# Patient Record
Sex: Male | Born: 1960 | Race: White | Hispanic: No | Marital: Married | Smoking: Current every day smoker
Health system: Southern US, Community
[De-identification: ages and names within clinical notes are randomized; demographics above are authoritative.]

## PROBLEM LIST (undated history)

## (undated) DIAGNOSIS — I10 Essential (primary) hypertension: Secondary | ICD-10-CM

## (undated) HISTORY — PX: KNEE SURGERY: SHX244

---

## 2005-11-17 ENCOUNTER — Ambulatory Visit (HOSPITAL_COMMUNITY): Admission: RE | Admit: 2005-11-17 | Discharge: 2005-11-17 | Payer: Self-pay | Admitting: Orthopedic Surgery

## 2006-01-05 ENCOUNTER — Ambulatory Visit (HOSPITAL_BASED_OUTPATIENT_CLINIC_OR_DEPARTMENT_OTHER): Admission: RE | Admit: 2006-01-05 | Discharge: 2006-01-05 | Payer: Self-pay | Admitting: Orthopedic Surgery

## 2006-10-05 ENCOUNTER — Emergency Department (HOSPITAL_COMMUNITY): Admission: EM | Admit: 2006-10-05 | Discharge: 2006-10-05 | Payer: Self-pay | Admitting: Family Medicine

## 2007-04-29 ENCOUNTER — Ambulatory Visit (HOSPITAL_BASED_OUTPATIENT_CLINIC_OR_DEPARTMENT_OTHER): Admission: RE | Admit: 2007-04-29 | Discharge: 2007-04-29 | Payer: Self-pay | Admitting: Orthopedic Surgery

## 2010-07-22 ENCOUNTER — Emergency Department (HOSPITAL_COMMUNITY): Admission: EM | Admit: 2010-07-22 | Discharge: 2010-07-22 | Payer: Self-pay | Admitting: Emergency Medicine

## 2010-09-16 ENCOUNTER — Ambulatory Visit: Payer: Self-pay | Admitting: Cardiology

## 2010-09-16 ENCOUNTER — Encounter (INDEPENDENT_AMBULATORY_CARE_PROVIDER_SITE_OTHER): Payer: Self-pay | Admitting: Otolaryngology

## 2010-09-16 ENCOUNTER — Ambulatory Visit (HOSPITAL_COMMUNITY): Admission: RE | Admit: 2010-09-16 | Discharge: 2010-09-17 | Payer: Self-pay | Admitting: Otolaryngology

## 2010-09-21 DIAGNOSIS — B1910 Unspecified viral hepatitis B without hepatic coma: Secondary | ICD-10-CM | POA: Insufficient documentation

## 2010-09-21 DIAGNOSIS — B159 Hepatitis A without hepatic coma: Secondary | ICD-10-CM | POA: Insufficient documentation

## 2010-09-21 DIAGNOSIS — Z87442 Personal history of urinary calculi: Secondary | ICD-10-CM

## 2010-09-21 DIAGNOSIS — F17200 Nicotine dependence, unspecified, uncomplicated: Secondary | ICD-10-CM

## 2010-09-21 DIAGNOSIS — I10 Essential (primary) hypertension: Secondary | ICD-10-CM | POA: Insufficient documentation

## 2011-01-17 LAB — ANAEROBIC CULTURE

## 2011-01-17 LAB — CBC
HCT: 45 % (ref 39.0–52.0)
Hemoglobin: 15.7 g/dL (ref 13.0–17.0)
MCH: 32.6 pg (ref 26.0–34.0)
MCHC: 34.9 g/dL (ref 30.0–36.0)
MCV: 93.4 fL (ref 78.0–100.0)
Platelets: 257 10*3/uL (ref 150–400)
RBC: 4.82 MIL/uL (ref 4.22–5.81)
RDW: 13.3 % (ref 11.5–15.5)
WBC: 11.3 10*3/uL — ABNORMAL HIGH (ref 4.0–10.5)

## 2011-01-17 LAB — BASIC METABOLIC PANEL WITH GFR
CO2: 27 meq/L (ref 19–32)
Chloride: 102 meq/L (ref 96–112)
GFR calc Af Amer: >60 mL/min (ref >60)
Potassium: 3.8 meq/L (ref 3.5–5.1)
Sodium: 138 meq/L (ref 135–145)

## 2011-01-17 LAB — BASIC METABOLIC PANEL
BUN: 6 mg/dL (ref 6–23)
Calcium: 9.2 mg/dL (ref 8.4–10.5)
Creatinine, Ser: 0.85 mg/dL (ref 0.4–1.5)
GFR calc non Af Amer: >60 mL/min (ref >60)
Glucose, Bld: 127 mg/dL — ABNORMAL HIGH (ref 70–99)

## 2011-01-17 LAB — SURGICAL PCR SCREEN
MRSA, PCR: NEGATIVE
Staphylococcus aureus: NEGATIVE

## 2011-01-17 LAB — URINALYSIS, ROUTINE W REFLEX MICROSCOPIC
Glucose, UA: NEGATIVE mg/dL
Hgb urine dipstick: NEGATIVE
Ketones, ur: NEGATIVE mg/dL
Nitrite: NEGATIVE
Protein, ur: NEGATIVE mg/dL
Specific Gravity, Urine: 1.026 (ref 1.005–1.030)
Urobilinogen, UA: 1 mg/dL (ref 0.0–1.0)
pH: 6 (ref 5.0–8.0)

## 2011-01-17 LAB — CULTURE, ROUTINE-SINUS

## 2011-01-19 LAB — CBC
HCT: 46.2 % (ref 39.0–52.0)
Hemoglobin: 15.8 g/dL (ref 13.0–17.0)
MCH: 32.1 pg (ref 26.0–34.0)
MCHC: 34.2 g/dL (ref 30.0–36.0)
RDW: 13.6 % (ref 11.5–15.5)

## 2011-01-19 LAB — COMPREHENSIVE METABOLIC PANEL
ALT: 64 U/L — ABNORMAL HIGH (ref 0–53)
CO2: 27 mEq/L (ref 19–32)
Calcium: 9.2 mg/dL (ref 8.4–10.5)
Creatinine, Ser: 1.14 mg/dL (ref 0.4–1.5)
GFR calc Af Amer: >60 mL/min (ref >60)
GFR calc non Af Amer: >60 mL/min (ref >60)
Glucose, Bld: 137 mg/dL — ABNORMAL HIGH (ref 70–99)
Sodium: 140 mEq/L (ref 135–145)
Total Protein: 7.7 g/dL (ref 6.0–8.3)

## 2011-01-19 LAB — URINE CULTURE
Culture  Setup Time: 201109162122
Culture: NO GROWTH

## 2011-01-19 LAB — DIFFERENTIAL
Lymphocytes Relative: 17 % (ref 12–46)
Lymphs Abs: 2 10*3/uL (ref 0.7–4.0)
Monocytes Relative: 6 % (ref 3–12)
Neutro Abs: 9.2 10*3/uL — ABNORMAL HIGH (ref 1.7–7.7)
Neutrophils Relative %: 76 % (ref 43–77)

## 2011-01-19 LAB — URINALYSIS, ROUTINE W REFLEX MICROSCOPIC
Bilirubin Urine: NEGATIVE
Ketones, ur: 15 mg/dL — AB
Leukocytes, UA: NEGATIVE
Nitrite: NEGATIVE
Protein, ur: NEGATIVE mg/dL
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
## 2011-03-24 NOTE — Op Note (Signed)
NAME:  Stephen Duncan, Stephen Duncan           ACCOUNT NO.:  [id number]   MEDICAL RECORD NO.:  [id number]          PATIENT TYPE:  AMB   LOCATION:  [facility]                         FACILITY:  [facility]   PHYSICIAN:  Marlowe Kays, M.D.  DATE OF BIRTH:  [birth year]   DATE OF PROCEDURE:  01/05/2006  DATE OF DISCHARGE:                                 OPERATIVE REPORT   PREOP DIAGNOSES:  1.  Torn medial meniscus.  2.  Osteoarthritis left knee.   POSTOP DIAGNOSES:  1.  Torn medial meniscus.  2.  Osteoarthritis left knee.   OPERATION:  Left knee arthroscopy with partial medial meniscectomy and  shaving of medial femoral condyle.   SURGEON:  Marlowe Kays, M.D.   ASSISTANT:  None.   ANESTHESIA:  General.   INDICATIONS FOR PROCEDURE:  Because of medial joint line pain and swelling.  He had an MRI of the left knee demonstrating the above findings.  He is here  today for the above-mentioned surgery for this reason.   DESCRIPTION OF PROCEDURE:  After satisfactory general anesthesia and a  pneumatic tourniquet, the left leg was Esmarched out nonsterilely.  A thigh  stabilizer applied and leg was prepped from stabilizer-to-ankle, DuraPrep,  and draped in a sterile field. Ace wrap and knee support for right knee.  Superior medial saline inflow, first through an anterolateral portal; medial  compartment of the knee joint was evaluated. He had a little scuffing of the  anterior portion of the medial meniscus with some synovitis present.  The  meniscus was shaved down until smooth; and the synovitis resected. He had  very substantial partial detachment of articular cartilage of the medial  femoral condyle; and I shaved this down until smooth with a combination of  3.5 shaver and baskets.  The posterior horn tear of the medial meniscus  extended just proximal to the curve, all the way into the intercondylar  area, but mainly at the curve and actually went back all the way to the rim  of the meniscus. I  resected the meniscus back to healthy appearing stable  rim.  Looking up in the medial gutter and suprapatellar area. He had some  minimal wear of the patella, but nothing arthroscopically treatable. I then  reversed portals, he had a little synovitis laterally. Once this was  resected with the shaver; had a good visualization of the lateral joint and  no treatable abnormalities were noted. There was minimal chondromalacia.   The knee joint was then irrigated until clear and all fluid possible was  removed.  The two anterior portals were closed with 4-0 nylon, 20 mL of 1/2%  Marcaine with adrenalin and 4 mg of morphine were then instilled through  the inflow apparatus which was removed; and this portal closed with 4-0  Nylon as well.  Betadine Adaptic dry sterile dressing were applied.  Tourniquet was released. He tolerated the procedure well and was taken to  the recovery room in satisfactory condition with no known complications.           ______________________________  Marlowe Kays, M.D.     JA/MEDQ  D:  01/05/2006  T:  01/05/2006  Job:  [id number]

## 2011-08-23 LAB — POCT HEMOGLOBIN-HEMACUE
Hemoglobin: 16.3
Operator id: 114531

## 2015-10-26 ENCOUNTER — Encounter (HOSPITAL_COMMUNITY): Payer: Self-pay | Admitting: *Deleted

## 2015-10-26 ENCOUNTER — Emergency Department (HOSPITAL_COMMUNITY)

## 2015-10-26 ENCOUNTER — Emergency Department (HOSPITAL_COMMUNITY)
Admission: EM | Admit: 2015-10-26 | Discharge: 2015-10-26 | Disposition: A | Discharge status: Home / Self Care | Attending: Emergency Medicine | Admitting: Emergency Medicine

## 2015-10-26 DIAGNOSIS — F17210 Nicotine dependence, cigarettes, uncomplicated: Secondary | ICD-10-CM | POA: Diagnosis not present

## 2015-10-26 DIAGNOSIS — I10 Essential (primary) hypertension: Secondary | ICD-10-CM | POA: Diagnosis not present

## 2015-10-26 DIAGNOSIS — N200 Calculus of kidney: Secondary | ICD-10-CM | POA: Insufficient documentation

## 2015-10-26 DIAGNOSIS — R1031 Right lower quadrant pain: Secondary | ICD-10-CM | POA: Diagnosis present

## 2015-10-26 DIAGNOSIS — R509 Fever, unspecified: Secondary | ICD-10-CM | POA: Diagnosis not present

## 2015-10-26 HISTORY — DX: Essential (primary) hypertension: I10

## 2015-10-26 LAB — CBC
HEMATOCRIT: 50.6 % (ref 39.0–52.0)
HEMOGLOBIN: 17 g/dL (ref 13.0–17.0)
MCH: 32.6 pg (ref 26.0–34.0)
MCHC: 33.6 g/dL (ref 30.0–36.0)
MCV: 96.9 fL (ref 78.0–100.0)
Platelets: 260 10*3/uL (ref 150–400)
RBC: 5.22 MIL/uL (ref 4.22–5.81)
RDW: 13.3 % (ref 11.5–15.5)
WBC: 8.3 10*3/uL (ref 4.0–10.5)

## 2015-10-26 LAB — URINALYSIS, ROUTINE W REFLEX MICROSCOPIC
Bilirubin Urine: NEGATIVE
Glucose, UA: NEGATIVE mg/dL
Ketones, ur: NEGATIVE mg/dL
Leukocytes, UA: NEGATIVE
Nitrite: NEGATIVE
Protein, ur: NEGATIVE mg/dL
Specific Gravity, Urine: 1.025 (ref 1.005–1.030)
pH: 5 (ref 5.0–8.0)

## 2015-10-26 LAB — COMPREHENSIVE METABOLIC PANEL
ALBUMIN: 4.4 g/dL (ref 3.5–5.0)
ALK PHOS: 98 U/L (ref 38–126)
ALT: 80 U/L — ABNORMAL HIGH (ref 17–63)
ANION GAP: 11 (ref 5–15)
AST: 58 U/L — AB (ref 15–41)
BILIRUBIN TOTAL: 0.9 mg/dL (ref 0.3–1.2)
BUN: 12 mg/dL (ref 6–20)
CO2: 28 mmol/L (ref 22–32)
Calcium: 9.6 mg/dL (ref 8.9–10.3)
Chloride: 102 mmol/L (ref 101–111)
Creatinine, Ser: 1.05 mg/dL (ref 0.61–1.24)
GFR calc Af Amer: >60 mL/min (ref >60)
GFR calc non Af Amer: >60 mL/min (ref >60)
GLUCOSE: 157 mg/dL — AB (ref 65–99)
POTASSIUM: 3.9 mmol/L (ref 3.5–5.1)
SODIUM: 141 mmol/L (ref 135–145)
Total Protein: 8.4 g/dL — ABNORMAL HIGH (ref 6.5–8.1)

## 2015-10-26 LAB — URINE MICROSCOPIC-ADD ON

## 2015-10-26 LAB — LIPASE, BLOOD: Lipase: 28 U/L (ref 11–51)

## 2015-10-26 LAB — CBG MONITORING, ED: Glucose-Capillary: 134 mg/dL — ABNORMAL HIGH (ref 65–99)

## 2015-10-26 MED ORDER — OXYCODONE HCL 5 MG PO TABS: 5.0000 mg | ORAL_TABLET | ORAL | Status: AC | PRN

## 2015-10-26 MED ORDER — TAMSULOSIN HCL 0.4 MG PO CAPS: 0.4000 mg | ORAL_CAPSULE | Freq: Every day | ORAL | Status: AC

## 2015-10-26 MED ORDER — ONDANSETRON HCL 4 MG/2ML IJ SOLN
4.0000 mg | Freq: Once | INTRAMUSCULAR | Status: AC
  Administered 2015-10-26: 4 mg via INTRAVENOUS
  Filled 2015-10-26: qty 2

## 2015-10-26 MED ORDER — IOHEXOL 300 MG/ML  SOLN
100.0000 mL | Freq: Once | INTRAMUSCULAR | Status: AC | PRN
  Administered 2015-10-26: 100 mL via INTRAVENOUS

## 2015-10-26 MED ORDER — ONDANSETRON 4 MG PO TBDP: ORAL_TABLET | ORAL | Status: DC

## 2015-10-26 MED ORDER — KETOROLAC TROMETHAMINE 30 MG/ML IJ SOLN
30.0000 mg | Freq: Once | INTRAMUSCULAR | Status: AC
  Administered 2015-10-26: 30 mg via INTRAVENOUS
  Filled 2015-10-26: qty 1

## 2015-10-26 MED ORDER — MORPHINE SULFATE (PF) 4 MG/ML IV SOLN
4.0000 mg | Freq: Once | INTRAVENOUS | Status: AC
  Administered 2015-10-26: 4 mg via INTRAVENOUS
  Filled 2015-10-26: qty 1

## 2015-10-26 MED ORDER — SODIUM CHLORIDE 0.9 % IV BOLUS (SEPSIS)
1000.0000 mL | Freq: Once | INTRAVENOUS | Status: AC
  Administered 2015-10-26: 1000 mL via INTRAVENOUS

## 2015-10-26 NOTE — Progress Notes (Signed)
[facility] ED CM noted pt with coverage but no pcp listed Spoke with pt who confirms no pcp Pt states he last saw jame Little and would probably return to him [facility] ED CM spoke with pt on how to obtain an in network pcp with insurance coverage via the customer service number or web site  Cm reviewed ED level of care for crisis/emergent services and community pcp level of care to manage continuous or chronic medical concerns.  The pt voiced understanding CM encouraged pt and discussed pt's responsibility to verify with pt's insurance carrier that any recommended medical provider offered by any emergency room or a hospital provider is within the carrier's network. The pt voiced understanding

## 2015-10-26 NOTE — Discharge Instructions (Signed)
Follow-up with urology. Strain urine so that they can analyze the stone. Return for fever uncontrolled pain and inability to drink. Kidney Stones Kidney stones (urolithiasis) are deposits that form inside your kidneys. The intense pain is caused by the stone moving through the urinary tract. When the stone moves, the ureter goes into spasm around the stone. The stone is usually passed in the urine.  CAUSES   A disorder that makes certain neck glands produce too much parathyroid hormone (primary hyperparathyroidism).  A buildup of uric acid crystals, similar to gout in your joints.  Narrowing (stricture) of the ureter.  A kidney obstruction present at birth (congenital obstruction).  Previous surgery on the kidney or ureters.  Numerous kidney infections. SYMPTOMS   Feeling sick to your stomach (nauseous).  Throwing up (vomiting).  Blood in the urine (hematuria).  Pain that usually spreads (radiates) to the groin.  Frequency or urgency of urination. DIAGNOSIS   Taking a history and physical exam.  Blood or urine tests.  CT scan.  Occasionally, an examination of the inside of the urinary bladder (cystoscopy) is performed. TREATMENT   Observation.  Increasing your fluid intake.  Extracorporeal shock wave lithotripsy--This is a noninvasive procedure that uses shock waves to break up kidney stones.  Surgery may be needed if you have severe pain or persistent obstruction. There are various surgical procedures. Most of the procedures are performed with the use of small instruments. Only small incisions are needed to accommodate these instruments, so recovery time is minimized. The size, location, and chemical composition are all important variables that will determine the proper choice of action for you. Talk to your health care provider to better understand your situation so that you will minimize the risk of injury to yourself and your kidney.  HOME CARE INSTRUCTIONS   Drink  enough water and fluids to keep your urine clear or pale yellow. This will help you to pass the stone or stone fragments.  Strain all urine through the provided strainer. Keep all particulate matter and stones for your health care provider to see. The stone causing the pain may be as small as a grain of salt. It is very important to use the strainer each and every time you pass your urine. The collection of your stone will allow your health care provider to analyze it and verify that a stone has actually passed. The stone analysis will often identify what you can do to reduce the incidence of recurrences.  Only take over-the-counter or prescription medicines for pain, discomfort, or fever as directed by your health care provider.  Keep all follow-up visits as told by your health care provider. This is important.  Get follow-up X-rays if required. The absence of pain does not always mean that the stone has passed. It may have only stopped moving. If the urine remains completely obstructed, it can cause loss of kidney function or even complete destruction of the kidney. It is your responsibility to make sure X-rays and follow-ups are completed. Ultrasounds of the kidney can show blockages and the status of the kidney. Ultrasounds are not associated with any radiation and can be performed easily in a matter of minutes.  Make changes to your daily diet as told by your health care provider. You may be told to:  Limit the amount of salt that you eat.  Eat 5 or more servings of fruits and vegetables each day.  Limit the amount of meat, poultry, fish, and eggs that you eat.  Collect a 24-hour urine sample as told by your health care provider.You may need to collect another urine sample every 6-12 months. SEEK MEDICAL CARE IF:  You experience pain that is progressive and unresponsive to any pain medicine you have been prescribed. SEEK IMMEDIATE MEDICAL CARE IF:   Pain cannot be controlled with the  prescribed medicine.  You have a fever or shaking chills.  The severity or intensity of pain increases over 18 hours and is not relieved by pain medicine.  You develop a new onset of abdominal pain.  You feel faint or pass out.  You are unable to urinate.   This information is not intended to replace advice given to you by your health care provider. Make sure you discuss any questions you have with your health care provider.   Document Released: 10/23/2005 Document Revised: 07/14/2015 Document Reviewed: 03/26/2013 Elsevier Interactive Patient Education [employer].

## 2015-10-26 NOTE — ED Notes (Signed)
Discharge instructions, follow-up care, and rx x3 reviewed with patient. Patient verbalized understanding. Patient given a urine strainer to catch stone.

## 2015-10-26 NOTE — ED Notes (Signed)
Pt reports RLQ pain since this morning. Had a bowel movement but this did not provide relief. Reports he has been sweating for last hour, nausea present, denies vomiting. denies blood in urine or stool.

## 2015-10-26 NOTE — ED Provider Notes (Signed)
CSN: [id number]     Arrival date & time 10/26/15  1043 History   First MD Initiated Contact with Patient 10/26/15 1119     Chief Complaint  Patient presents with  . Abdominal Pain     (Consider location/radiation/quality/duration/timing/severity/associated sxs/prior Treatment) Patient is a 54 y.o. male presenting with abdominal pain. The history is provided by the patient.  Abdominal Pain Pain location:  RLQ Pain quality: sharp and shooting   Pain radiates to:  Does not radiate Pain severity:  Moderate Onset quality:  Sudden Duration:  6 hours Timing:  Constant Progression:  Unchanged Chronicity:  New Relieved by:  Nothing Worsened by:  Nothing tried Ineffective treatments:  Bowel activity Associated symptoms: chills, fever and nausea   Associated symptoms: no chest pain, no diarrhea, no shortness of breath and no vomiting     54 yo M with a chief complaint of right lower quadrant abdominal pain. The started this morning. Patient tried had bowel movement but with no relief. Says it feels like a mild burning. Denies radiation subjective fevers and chills nausea but no vomiting. Positive for anorexia. No prior abdominal surgery.  Past Medical History  Diagnosis Date  . Hypertension    Past Surgical History  Procedure Laterality Date  . Knee surgery      left    History reviewed. No pertinent family history. Social History  Substance Use Topics  . Smoking status: Current Every Day Smoker -- 1.50 packs/day for 30 years    Types: Cigarettes  . Smokeless tobacco: None  . Alcohol Use: No    Review of Systems  Constitutional: Positive for fever and chills.  HENT: Negative for congestion and facial swelling.   Eyes: Negative for discharge and visual disturbance.  Respiratory: Negative for shortness of breath.   Cardiovascular: Negative for chest pain and palpitations.  Gastrointestinal: Positive for nausea and abdominal pain. Negative for vomiting and diarrhea.   Musculoskeletal: Negative for myalgias and arthralgias.  Skin: Negative for color change and rash.  Neurological: Negative for tremors, syncope and headaches.  Psychiatric/Behavioral: Negative for confusion and dysphoric mood.      Allergies  Review of patient's allergies indicates no known allergies.  Home Medications   Prior to Admission medications   Medication Sig Start Date End Date Taking? Authorizing Provider  AMOXICILLIN PO Take 1 tablet by mouth once.   Yes Historical Provider, MD  naproxen sodium (ANAPROX) 220 MG tablet Take 220 mg by mouth 2 (two) times daily as needed (pain).   Yes Historical Provider, MD  ondansetron (ZOFRAN ODT) 4 MG disintegrating tablet  ODT q4 hours prn nausea/vomit 10/26/15   Melene Plan, DO  oxyCODONE (ROXICODONE) 5 MG immediate release tablet Take 1 tablet (5 mg total) by mouth every 4 (four) hours as needed for severe pain. 10/26/15   Melene Plan, DO  tamsulosin (FLOMAX) 0.4 MG CAPS capsule Take 1 capsule (0.4 mg total) by mouth daily after supper. 10/26/15   Melene Plan, DO   BP 157/96 mmHg  Pulse 75  Temp(Src) 97.6 F (36.4 C) (Oral)  Resp 18  SpO2 94% Physical Exam  Constitutional: He is oriented to person, place, and time. He appears well-developed and well-nourished.  HENT:  Head: Normocephalic and atraumatic.  Eyes: EOM are normal. Pupils are equal, round, and reactive to light.  Neck: Normal range of motion. Neck supple. No JVD present.  Cardiovascular: Normal rate and regular rhythm.  Exam reveals no gallop and no friction rub.   No murmur heard.  Pulmonary/Chest: No respiratory distress. He has no wheezes.  Abdominal: He exhibits no distension. There is tenderness (tender palpation worst at the extreme right lower abdomen. Negative Ross takes negative psoas). There is no rebound and no guarding.  Musculoskeletal: Normal range of motion.  Neurological: He is alert and oriented to person, place, and time.  Skin: No rash noted. No  pallor.  Psychiatric: He has a normal mood and affect. His behavior is normal.  Nursing note and vitals reviewed.   ED Course  Procedures (including critical care time) Labs Review Labs Reviewed  COMPREHENSIVE METABOLIC PANEL - Abnormal; Notable for the following:    Glucose, Bld 157 (*)    Total Protein 8.4 (*)    AST 58 (*)    ALT 80 (*)    All other components within normal limits  URINALYSIS, ROUTINE W REFLEX MICROSCOPIC (NOT AT [facility]) - Abnormal; Notable for the following:    Color, Urine AMBER (*)    APPearance CLOUDY (*)    Hgb urine dipstick MODERATE (*)    All other components within normal limits  URINE MICROSCOPIC-ADD ON - Abnormal; Notable for the following:    Squamous Epithelial / LPF 0-5 (*)    Bacteria, UA RARE (*)    Casts GRANULAR CAST (*)    All other components within normal limits  CBG MONITORING, ED - Abnormal; Notable for the following:    Glucose-Capillary 134 (*)    All other components within normal limits  LIPASE, BLOOD  CBC    Imaging Review Ct Abdomen Pelvis W Contrast  10/26/2015  CLINICAL DATA:  Patient with right lower quadrant abdominal pain since this morning. EXAM: CT ABDOMEN AND PELVIS WITH CONTRAST TECHNIQUE: Multidetector CT imaging of the abdomen and pelvis was performed using the standard protocol following bolus administration of intravenous contrast. CONTRAST:  OMNIPAQUE IOHEXOL 300 MG/ML  SOLN COMPARISON:  CT abdomen pelvis 07/22/2010 FINDINGS: Lower chest: Normal heart size. Dependent atelectasis within the bilateral lobes. There is a 5 mm right middle lobe pulmonary nodule (image 4; series 6). No pleural effusion. Hepatobiliary: Liver is normal in size and contour. No focal hepatic lesion is identified. Gallbladder is unremarkable. Pancreas: Unremarkable Spleen: Unremarkable Adrenals/Urinary Tract: Unchanged mild nodularity of left adrenal gland. Right adrenal gland is normal. Multiple 2-3 mm stones are demonstrated within the right  kidney, particularly within the interpolar region and lower pole. The right kidney is enlarged and delayed in enhancement. Delayed images demonstrate no excretion of contrast material into the right renal collecting system ureter. There is moderate right hydroureteronephrosis to level of a 3 mm stone within the distal right ureter at the level of the UVJ. Simple cyst off the superior pole left kidney measuring 2.2 cm. Multiple additional bilateral renal hypodensities too small to characterize. Right renal perinephric fat stranding. Stomach/Bowel: Sigmoid colonic diverticulosis. No CT evidence for acute diverticulitis. The appendix is normal. No abnormal bowel wall thickening or evidence for bowel obstruction. Stomach is normal morphology. Vascular/Lymphatic: Normal caliber abdominal aorta. Circumaortic left renal vein. No retroperitoneal lymphadenopathy. No pelvic lymphadenopathy. Other: Prostate is unremarkable. Left-greater-than-right bilateral fat containing inguinal hernias. Musculoskeletal: Lumbar spine degenerative changes. IMPRESSION: There is an obstructing 3 mm stone within the distal right ureter at the UVJ resulting in moderate right hydroureteronephrosis. Additional bilateral nephrolithiasis as described above. 5 mm right middle lobe nodule. If the patient is at high risk for bronchogenic carcinoma, follow-up chest CT at 6-12 months is recommended. If the patient is at low risk for  bronchogenic carcinoma, follow-up chest CT at 12 months is recommended. This recommendation follows the consensus statement: Guidelines for Management of Small Pulmonary Nodules Detected on CT Scans: A Statement from the Fleischner Society as published in Radiology 2005;237:395-400. Electronically Signed   By: Annia Beltrew  Davis M.D.   On: 10/26/2015 14:28   I have personally reviewed and evaluated these images and lab results as part of my medical decision-making.   EKG Interpretation None      MDM   Final diagnoses:   Nephrolithiasis    54  Yo M with a chief complaint of right lower quadrant abdominal pain. This pain is lower than I would normally expect for appendicitis. Patient having no testicular or penile tenderness. Will obtain a CT scan to evaluate.  CT scan with 3mm stone in the UVJ.  Pain well controlled, awaiting UA.   UA negative for infection, pain well controlled.  Urology follow up.   I have discussed the diagnosis/risks/treatment options with the patient and family and believe the pt to be eligible for discharge home to follow-up with Urology. We also discussed returning to the ED immediately if new or worsening sx occur. We discussed the sx which are most concerning (e.g., sudden worsening pain, fever, inability to tolerate by mouth) that necessitate immediate return. Medications administered to the patient during their visit and any new prescriptions provided to the patient are listed below.  Medications given during this visit Medications  morphine 4 MG/ML injection 4 mg (4 mg Intravenous Given 10/26/15 1158)  ondansetron (ZOFRAN) injection 4 mg (4 mg Intravenous Given 10/26/15 1158)  sodium chloride 0.9 % bolus 1,000 mL (0 mLs Intravenous Stopped 10/26/15 1246)  iohexol (OMNIPAQUE) 300 MG/ML solution 100 mL (100 mLs Intravenous Contrast Given 10/26/15 1357)  ketorolac (TORADOL) 30 MG/ML injection 30 mg (30 mg Intravenous Given 10/26/15 1519)  morphine 4 MG/ML injection 4 mg (4 mg Intravenous Given 10/26/15 1518)  ondansetron (ZOFRAN) injection 4 mg (4 mg Intravenous Given 10/26/15 1519)    Discharge Medication List as of 10/26/2015  3:59 PM      The patient appears reasonably screen and/or stabilized for discharge and I doubt any other medical condition or other [facility] requiring further screening, evaluation, or treatment in the ED at this time prior to discharge.    Melene Planan Ashleigh Luckow, DO 10/27/15 [phone]

## 2021-08-14 ENCOUNTER — Encounter (HOSPITAL_COMMUNITY): Payer: Self-pay | Admitting: Emergency Medicine

## 2021-08-14 ENCOUNTER — Emergency Department (HOSPITAL_COMMUNITY)
Admission: EM | Admit: 2021-08-14 | Discharge: 2021-08-14 | Disposition: A | Discharge status: Home / Self Care | Attending: Emergency Medicine | Admitting: Emergency Medicine

## 2021-08-14 ENCOUNTER — Emergency Department (HOSPITAL_COMMUNITY)

## 2021-08-14 DIAGNOSIS — F17210 Nicotine dependence, cigarettes, uncomplicated: Secondary | ICD-10-CM | POA: Insufficient documentation

## 2021-08-14 DIAGNOSIS — E669 Obesity, unspecified: Secondary | ICD-10-CM | POA: Diagnosis not present

## 2021-08-14 DIAGNOSIS — K5730 Diverticulosis of large intestine without perforation or abscess without bleeding: Secondary | ICD-10-CM | POA: Diagnosis not present

## 2021-08-14 DIAGNOSIS — N200 Calculus of kidney: Secondary | ICD-10-CM

## 2021-08-14 DIAGNOSIS — I10 Essential (primary) hypertension: Secondary | ICD-10-CM | POA: Diagnosis not present

## 2021-08-14 DIAGNOSIS — N132 Hydronephrosis with renal and ureteral calculous obstruction: Secondary | ICD-10-CM | POA: Diagnosis not present

## 2021-08-14 DIAGNOSIS — R109 Unspecified abdominal pain: Secondary | ICD-10-CM | POA: Diagnosis present

## 2021-08-14 LAB — BASIC METABOLIC PANEL
Anion gap: 12 (ref 5–15)
BUN: 15 mg/dL (ref 8–23)
CO2: 22 mmol/L (ref 22–32)
Calcium: 9.8 mg/dL (ref 8.9–10.3)
Chloride: 108 mmol/L (ref 98–111)
Creatinine, Ser: 1.09 mg/dL (ref 0.61–1.24)
GFR, Estimated: >60 mL/min (ref >60)
Glucose, Bld: 272 mg/dL — ABNORMAL HIGH (ref 70–99)
Potassium: 4.4 mmol/L (ref 3.5–5.1)
Sodium: 142 mmol/L (ref 135–145)

## 2021-08-14 LAB — CBC
HCT: 47.8 % (ref 39.0–52.0)
Hemoglobin: 16 g/dL (ref 13.0–17.0)
MCH: 32.4 pg (ref 26.0–34.0)
MCHC: 33.5 g/dL (ref 30.0–36.0)
MCV: 96.8 fL (ref 80.0–100.0)
Platelets: 291 10*3/uL (ref 150–400)
RBC: 4.94 MIL/uL (ref 4.22–5.81)
RDW: 12.9 % (ref 11.5–15.5)
WBC: 10 10*3/uL (ref 4.0–10.5)
nRBC: 0 % (ref 0.0–0.2)

## 2021-08-14 LAB — URINALYSIS, ROUTINE W REFLEX MICROSCOPIC
Bilirubin Urine: NEGATIVE
Glucose, UA: 50 mg/dL — AB
Ketones, ur: NEGATIVE mg/dL
Leukocytes,Ua: NEGATIVE
Nitrite: NEGATIVE
Protein, ur: 30 mg/dL — AB
RBC / HPF: >50 RBC/hpf — ABNORMAL HIGH (ref 0–5)
Specific Gravity, Urine: 1.025 (ref 1.005–1.030)
pH: 5 (ref 5.0–8.0)

## 2021-08-14 MED ORDER — KETOROLAC TROMETHAMINE 30 MG/ML IJ SOLN
15.0000 mg | Freq: Once | INTRAMUSCULAR | Status: AC
  Administered 2021-08-14: 15 mg via INTRAMUSCULAR
  Filled 2021-08-14: qty 1

## 2021-08-14 MED ORDER — ONDANSETRON 4 MG PO TBDP: 4.0000 mg | ORAL_TABLET | Freq: Three times a day (TID) | ORAL | 0 refills | Status: AC | PRN

## 2021-08-14 NOTE — Discharge Instructions (Addendum)
You were seen today for pain.  Your pain is likely related to kidney stone.  Continue Tylenol as needed for pain.  Take Zofran for nausea.  Make sure that you are staying well-hydrated.  Follow-up with urology as needed.  You likely pass the stone on your own.

## 2021-08-14 NOTE — ED Notes (Signed)
Pt took 1000 mg of acetaminophen around 23:30 prior to arrival.

## 2021-08-14 NOTE — ED Triage Notes (Signed)
Pt c/o dull right sided flank pain x 1 hour. Hx of kidney stones. CBG 260

## 2021-08-14 NOTE — ED Provider Notes (Signed)
[facility]-EMERGENCY DEPT Provider Note   CSN: [id number] Arrival date & time: 08/14/21  0022     History Chief Complaint  Patient presents with   Flank Pain    Stephen Duncan is a 60 y.o. male.  HPI     This is a 60 year old male with a history of hypertension and hepatitis who presents with nausea and right sided flank pain.  Patient reports that he developed dull right-sided flank pain 1 hour prior to arrival.  He states he had a kidney stone 8 years ago that felt similar.  Pain did not radiate.  He took 2 extra strength Tylenol with improvement of his pain.  Currently he is pain-free.  He reports associated nausea and vomiting related to pain prior to arrival.  Denies any recent fevers.  He does state that he urinated while in the waiting room and noted blood in his urine.  Past Medical History:  Diagnosis Date   Hypertension     Patient Active Problem List   Diagnosis Date Noted   HEPATITIS A 09/21/2010   HEPATITIS B 09/21/2010   TOBACCO USER 09/21/2010   HYPERTENSION 09/21/2010   RENAL CALCULUS, HX OF 09/21/2010    Past Surgical History:  Procedure Laterality Date   KNEE SURGERY     left        No family history on file.  Social History   Tobacco Use   Smoking status: Every Day    Packs/day: 1.50    Years: 30.00    Pack years: 45.00    Types: Cigarettes  Substance Use Topics   Alcohol use: No    Home Medications Prior to Admission medications   Medication Sig Start Date End Date Taking? Authorizing Provider  ondansetron (ZOFRAN ODT) 4 MG disintegrating tablet Take 1 tablet (4 mg total) by mouth every 8 (eight) hours as needed for nausea or vomiting. 08/14/21  Yes Caydance Kuehnle, Mayer Masker, MD  AMOXICILLIN PO Take 1 tablet by mouth once.    [provider]  naproxen sodium (ANAPROX) 220 MG tablet Take 220 mg by mouth 2 (two) times daily as needed (pain).    [provider]  oxyCODONE (ROXICODONE) 5 MG immediate  release tablet Take 1 tablet (5 mg total) by mouth every 4 (four) hours as needed for severe pain. 10/26/15   Melene Plan, DO  tamsulosin (FLOMAX) 0.4 MG CAPS capsule Take 1 capsule (0.4 mg total) by mouth daily after supper. 10/26/15   Melene Plan, DO    Allergies    Patient has no known allergies.  Review of Systems   Review of Systems  Constitutional:  Negative for fever.  Respiratory:  Negative for shortness of breath.   Cardiovascular:  Negative for chest pain.  Gastrointestinal:  Negative for abdominal pain.  Genitourinary:  Positive for flank pain and hematuria. Negative for dysuria.  All other systems reviewed and are negative.  Physical Exam Updated Vital Signs BP (!) 126/107   Pulse 99   Temp 99.9 F (37.7 C) (Oral)   Resp 18   Ht 1.778 m (5\' 10" )   Wt 122.5 kg   SpO2 96%   BMI 38.74 kg/m   Physical Exam Vitals and nursing note reviewed.  Constitutional:      Appearance: He is well-developed. He is obese. He is not ill-appearing.  HENT:     Head: Normocephalic and atraumatic.     Mouth/Throat:     Mouth: Mucous membranes are moist.  Eyes:  Pupils: Pupils are equal, round, and reactive to light.  Cardiovascular:     Rate and Rhythm: Normal rate and regular rhythm.     Heart sounds: Normal heart sounds.  Pulmonary:     Effort: Pulmonary effort is normal. No respiratory distress.     Breath sounds: Normal breath sounds. No wheezing.  Abdominal:     General: Bowel sounds are normal.     Palpations: Abdomen is soft.     Tenderness: There is no abdominal tenderness. There is no right CVA tenderness, left CVA tenderness or rebound.  Musculoskeletal:     Cervical back: Neck supple.     Right lower leg: No edema.     Left lower leg: No edema.  Skin:    General: Skin is warm and dry.  Neurological:     Mental Status: He is alert and oriented to person, place, and time.  Psychiatric:        Mood and Affect: Mood normal.    ED Results / Procedures /  Treatments   Labs (all labs ordered are listed, but only abnormal results are displayed) Labs Reviewed  URINALYSIS, ROUTINE W REFLEX MICROSCOPIC - Abnormal; Notable for the following components:      Result Value   APPearance CLOUDY (*)    Glucose, UA 50 (*)    Hgb urine dipstick LARGE (*)    Protein, ur 30 (*)    RBC / HPF >50 (*)    Bacteria, UA RARE (*)    All other components within normal limits  BASIC METABOLIC PANEL - Abnormal; Notable for the following components:   Glucose, Bld 272 (*)    All other components within normal limits  CBC    EKG None  Radiology CT Renal Stone Study  Result Date: 08/14/2021 CLINICAL DATA:  60 year old male with history of dull right-sided flank pain for 1 hour. EXAM: CT ABDOMEN AND PELVIS WITHOUT CONTRAST TECHNIQUE: Multidetector CT imaging of the abdomen and pelvis was performed following the standard protocol without IV contrast. COMPARISON:  CT the abdomen and pelvis 10/26/2015. FINDINGS: Lower chest: Mild scarring in the lung bases bilaterally. Atherosclerotic calcifications in the distal descending thoracic aorta. Hepatobiliary: Diffuse low attenuation throughout the hepatic parenchyma, indicative of hepatic steatosis. No discrete cystic or solid hepatic lesions are confidently identified on today's noncontrast CT examination. Unenhanced appearance of the gallbladder is normal. Pancreas: No definite pancreatic mass or peripancreatic fluid collections or inflammatory changes are noted on today's noncontrast CT examination. Spleen: Unremarkable. Adrenals/Urinary Tract: Multiple nonobstructive calculi are noted within the collecting system of the right kidney measuring up to 7 mm in the lower pole. In addition, in the proximal third of the right kidney (axial image 47 of series 3) there is a 3 mm calculus. This is associated with mild proximal right hydroureteronephrosis. No additional calculi are identified within the collecting system of the left  kidney, along the course of the left ureter or within the lumen of the urinary bladder. No left hydroureteronephrosis. Subcentimeter low-attenuation lesions in the interpolar region of the right kidney and interpolar region of the left kidney, incompletely characterized on today's non-contrast CT examination, but similar compared to the prior CT from 10/26/2015, statistically likely to represent cysts. Urinary bladder is nearly completely decompressed, but otherwise unremarkable in appearance. Bilateral adrenal glands are normal in appearance. Stomach/Bowel: The unenhanced appearance of the stomach is normal. No pathologic dilatation of small bowel or colon. Numerous colonic diverticulae are noted, particularly in the descending colon and  sigmoid colon, without surrounding inflammatory changes to suggest an acute diverticulitis at this time. Normal appendix. Vascular/Lymphatic: Aortic atherosclerosis. Circumaortic left renal vein (normal anatomical variant) incidentally noted. No lymphadenopathy noted in the abdomen or pelvis. Reproductive: Prostate gland and seminal vesicles are unremarkable in appearance. Other: No significant volume of ascites.  No pneumoperitoneum. Musculoskeletal: There are no aggressive appearing lytic or blastic lesions noted in the visualized portions of the skeleton. IMPRESSION: 1. 3 mm obstructive calculus in the proximal third of the right ureter. 2. Nonobstructive calculi in the right renal collecting system measuring up to 7 mm in the lower pole. 3. Extensive colonic diverticulosis without evidence to suggest acute diverticulitis at this time. 4. Aortic atherosclerosis. Electronically Signed   By: Trudie Reed M.D.   On: 08/14/2021 05:53    Procedures Procedures   Medications Ordered in ED Medications  ketorolac (TORADOL) 30 MG/ML injection 15 mg (15 mg Intramuscular Given 08/14/21 1829)    ED Course  I have reviewed the triage vital signs and the nursing  notes.  Pertinent labs & imaging results that were available during my care of the patient were reviewed by me and considered in my medical decision making (see chart for details).    MDM Rules/Calculators/A&P                           Patient presents with right-sided flank pain.  Mostly resolved on my evaluation.  He is overall nontoxic and vital signs are reassuring.  Exam is benign.  Highly suspect kidney stone given history of the same with similar symptoms.  Other considerations include urinary tract infection, gallbladder pathology.  Patient is pain-free so did not require any pain medication.  Labs reviewed from triage and shows large blood with greater than 50 red cells in his urine.  Blood glucose is 272 without anion gap.  CT scan ordered from triage reviewed and shows a 3 mm distal right ureteral stone.  This is likely the culprit of the patient's symptoms.  He remains asymptomatic on recheck.  Discussed supportive measures at home.  Suspect this kidney stone will pass on its own.  Patient discharged with Zofran.  Tylenol seem to help the patient significantly previously so feel that he can continue Tylenol as needed.  He was also encouraged to hydrate aggressively.  After history, exam, and medical workup I feel the patient has been appropriately medically screened and is safe for discharge home. Pertinent diagnoses were discussed with the patient. Patient was given return precautions.  Final Clinical Impression(s) / ED Diagnoses Final diagnoses:  Kidney stone    Rx / DC Orders ED Discharge Orders          Ordered    ondansetron (ZOFRAN ODT) 4 MG disintegrating tablet  Every 8 hours PRN        08/14/21 0617             Shon Baton, MD 08/14/21 [phone]

## 2022-02-15 IMAGING — CT CT RENAL STONE PROTOCOL
2 of 4 series · 16 of 46 positions shown, 18 images · non-contrast
Comparison: CT the abdomen and pelvis 10/26/2015.

CLINICAL DATA: 80-year-old male with history of dull right-sided
flank pain for 1 hour.

EXAM:
CT ABDOMEN AND PELVIS WITHOUT CONTRAST
TECHNIQUE: Multidetector CT imaging of the abdomen and pelvis was performed
following the standard protocol without IV contrast.

[Series 3: axial st · axial · 0.98mm/px · z∈[-532,-107]mm · 13 of 95 slices shown, 15 images]
[im 5/95  soft-tissue]
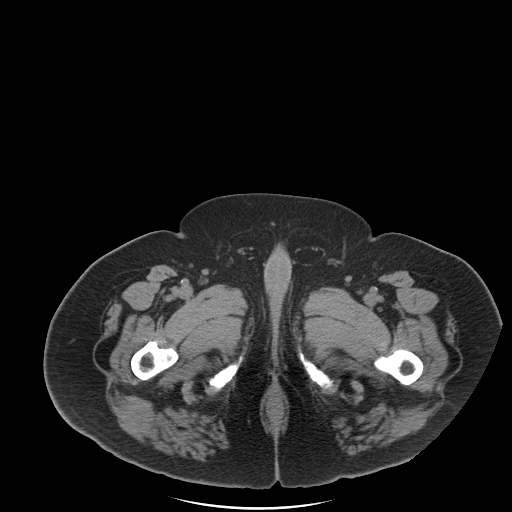
[im 5/95  bone]
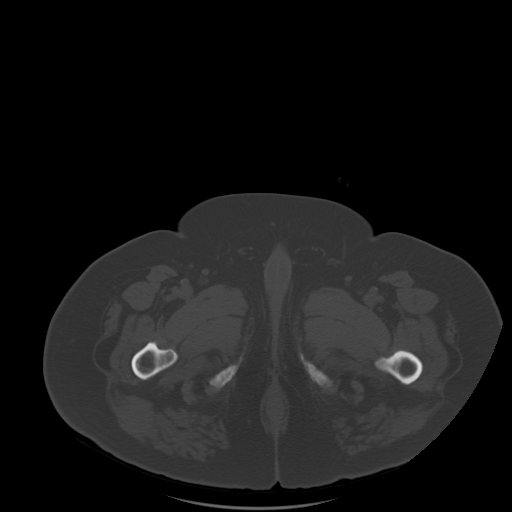
[im 15/95  soft-tissue]
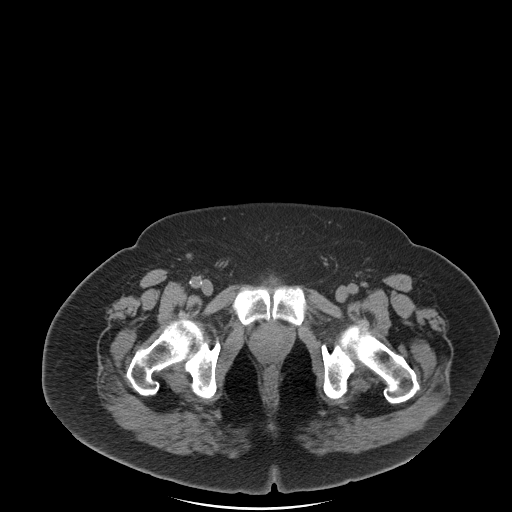
[im 19/95  soft-tissue]
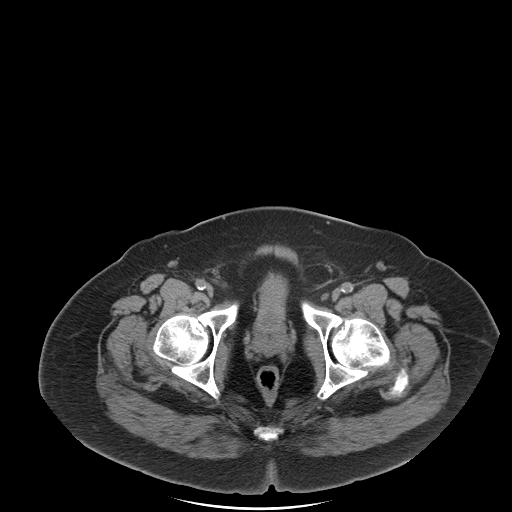
[im 29/95  soft-tissue]
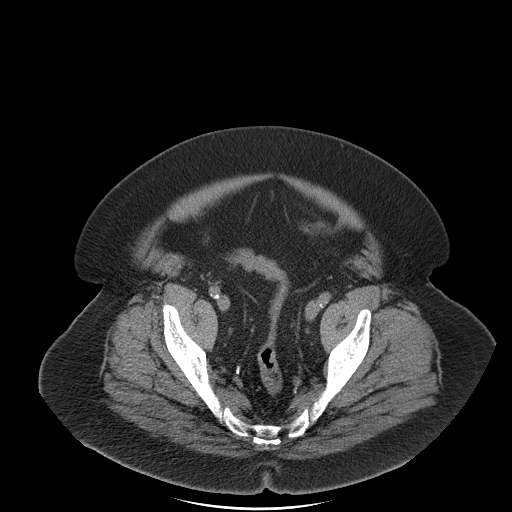
[im 33/95  soft-tissue]
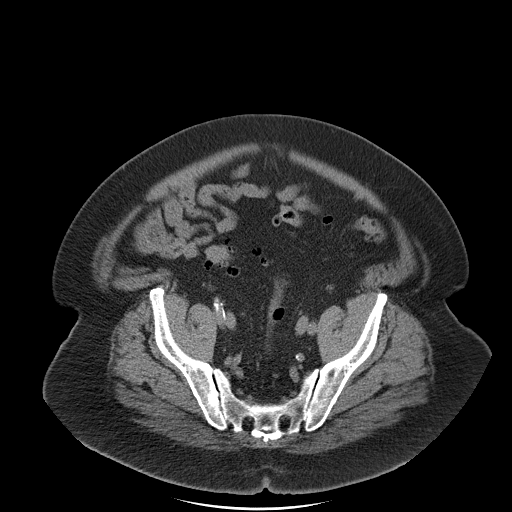
[im 43/95  soft-tissue]
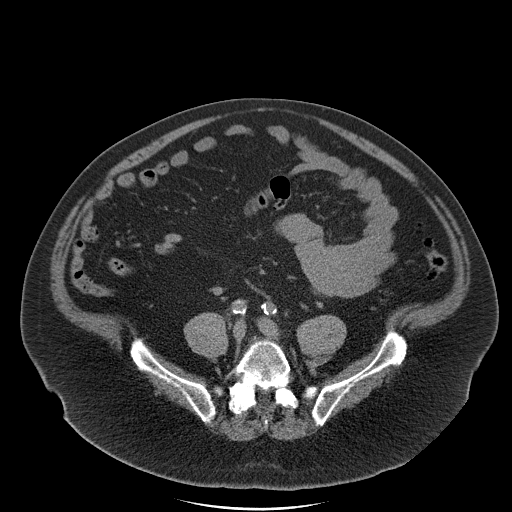
[im 48/95  soft-tissue]
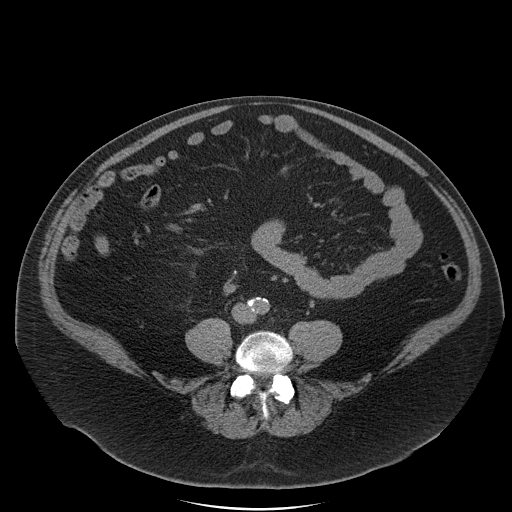
[im 52/95  soft-tissue]
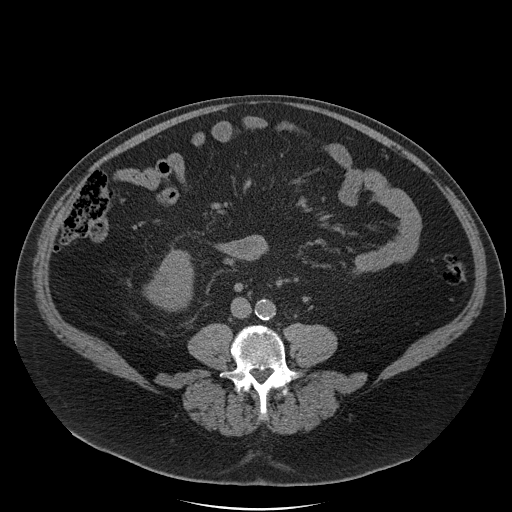
[im 62/95  soft-tissue]
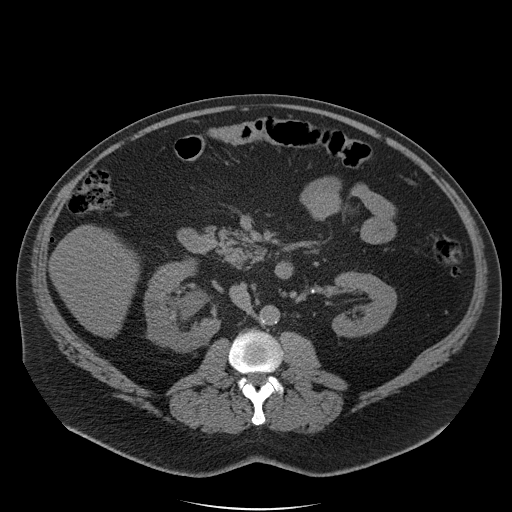
[im 62/95  bone]
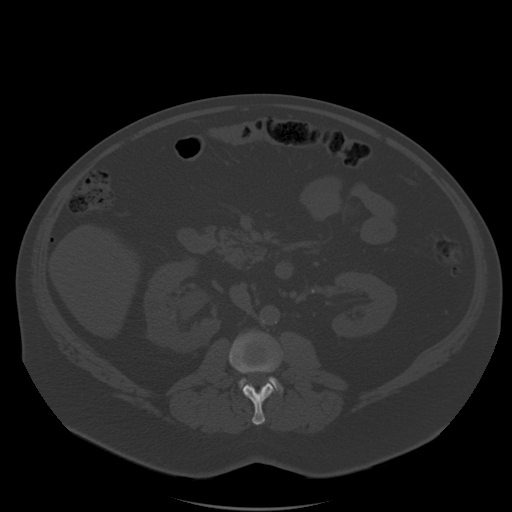
[im 66/95  soft-tissue]
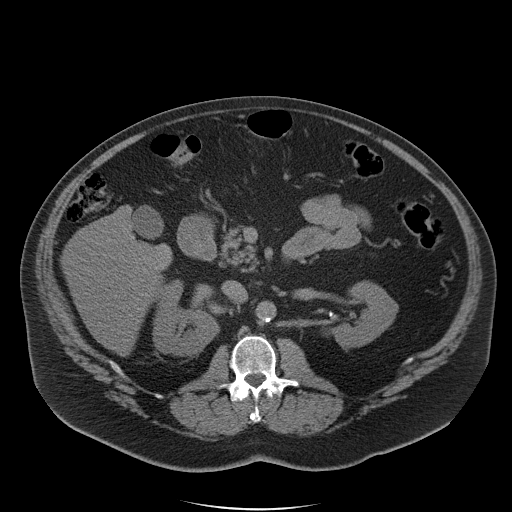
[im 76/95  soft-tissue]
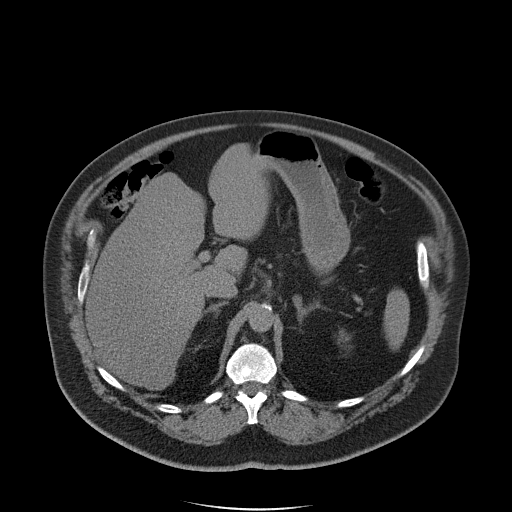
[im 80/95  soft-tissue]
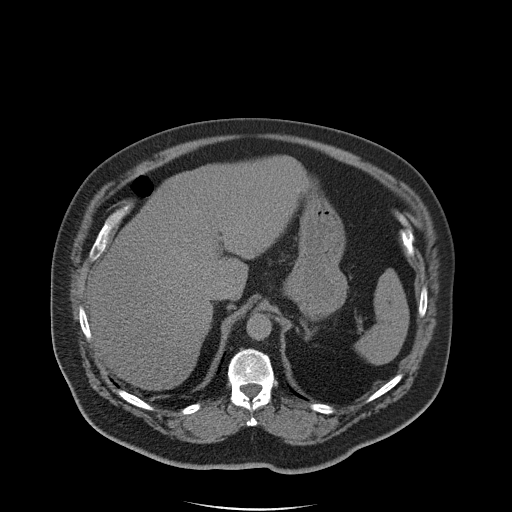
[im 90/95  soft-tissue]
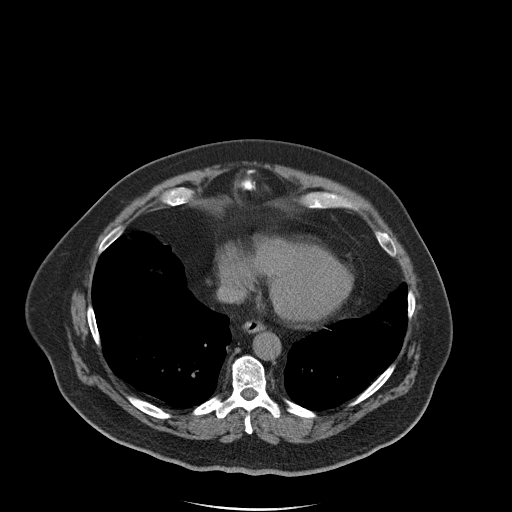

[Series 5: coronal · coronal · 0.98mm/px · 3 of 208 slices shown]
[im 70/208  soft-tissue]
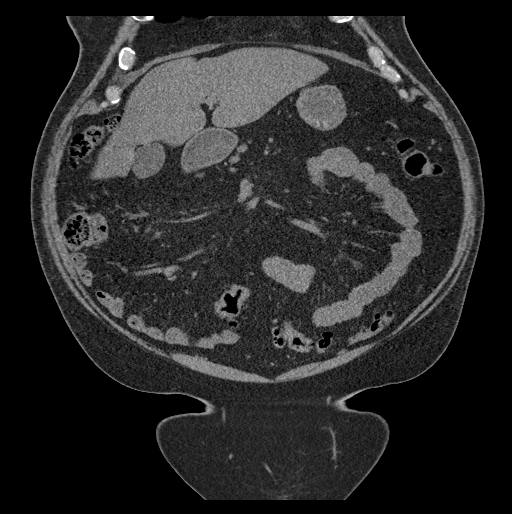
[im 93/208  soft-tissue]
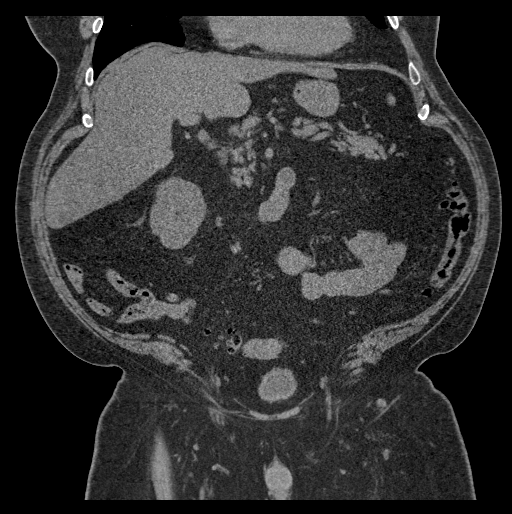
[im 116/208  soft-tissue]
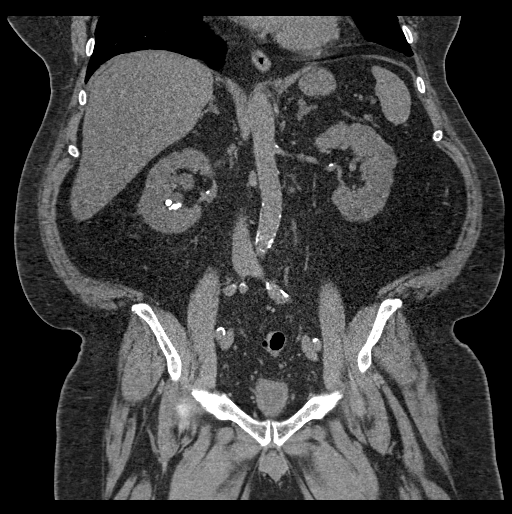

[16 of 46 positions shown; findings below may reference images not displayed]

FINDINGS: Lower chest: Mild scarring in the lung bases bilaterally.
Atherosclerotic calcifications in the distal descending thoracic
aorta.

Hepatobiliary: Diffuse low attenuation throughout the hepatic
parenchyma, indicative of hepatic steatosis. No discrete cystic or
solid hepatic lesions are confidently identified on today's
noncontrast CT examination. Unenhanced appearance of the gallbladder
is normal.

Pancreas: No definite pancreatic mass or peripancreatic fluid
collections or inflammatory changes are noted on today's noncontrast
CT examination.

Spleen: Unremarkable.

Adrenals/Urinary Tract: Multiple nonobstructive calculi are noted
within the collecting system of the right kidney measuring up to 7
mm in the lower pole. In addition, in the proximal third of the
right kidney (axial image 47 of series 3) there is a 3 mm calculus.
This is associated with mild proximal right hydroureteronephrosis.
No additional calculi are identified within the collecting system of
the left kidney, along the course of the left ureter or within the
lumen of the urinary bladder. No left hydroureteronephrosis.
Subcentimeter low-attenuation lesions in the interpolar region of
the right kidney and interpolar region of the left kidney,
incompletely characterized on today's non-contrast CT examination,
but similar compared to the prior CT from 10/26/2015, statistically
likely to represent cysts. Urinary bladder is nearly completely
decompressed, but otherwise unremarkable in appearance. Bilateral
adrenal glands are normal in appearance.

Stomach/Bowel: The unenhanced appearance of the stomach is normal.
No pathologic dilatation of small bowel or colon. Numerous colonic
diverticulae are noted, particularly in the descending colon and
sigmoid colon, without surrounding inflammatory changes to suggest
an acute diverticulitis at this time. Normal appendix.

Vascular/Lymphatic: Aortic atherosclerosis. Circumaortic left renal
vein (normal anatomical variant) incidentally noted. No
lymphadenopathy noted in the abdomen or pelvis.

Reproductive: Prostate gland and seminal vesicles are unremarkable
in appearance.

Other: No significant volume of ascites.  No pneumoperitoneum.

Musculoskeletal: There are no aggressive appearing lytic or blastic
lesions noted in the visualized portions of the skeleton.
IMPRESSION: 1. 3 mm obstructive calculus in the proximal third of the right
ureter.
2. Nonobstructive calculi in the right renal collecting system
measuring up to 7 mm in the lower pole.
3. Extensive colonic diverticulosis without evidence to suggest
acute diverticulitis at this time.
4. Aortic atherosclerosis.

## 2024-07-01 ENCOUNTER — Other Ambulatory Visit (HOSPITAL_COMMUNITY): Payer: Self-pay | Admitting: Physician Assistant

## 2024-07-01 DIAGNOSIS — F17211 Nicotine dependence, cigarettes, in remission: Secondary | ICD-10-CM

## 2024-07-04 ENCOUNTER — Ambulatory Visit (HOSPITAL_COMMUNITY)

## 2024-07-04 ENCOUNTER — Encounter (HOSPITAL_COMMUNITY): Payer: Self-pay

## 2024-07-14 ENCOUNTER — Ambulatory Visit (HOSPITAL_COMMUNITY)

## 2024-08-25 ENCOUNTER — Ambulatory Visit (HOSPITAL_COMMUNITY)
Admission: RE | Admit: 2024-08-25 | Discharge: 2024-08-25 | Disposition: A | Discharge status: Home / Self Care | Source: Ambulatory Visit | Attending: Physician Assistant | Admitting: Physician Assistant

## 2024-08-25 DIAGNOSIS — F17211 Nicotine dependence, cigarettes, in remission: Secondary | ICD-10-CM | POA: Diagnosis present
